# Patient Record
Sex: Female | Born: 2000 | Race: White | Hispanic: No | Marital: Single | Smoking: Never smoker
Health system: Southern US, Community
[De-identification: ages and names within clinical notes are randomized; demographics above are authoritative.]

---
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
## 2021-09-22 ENCOUNTER — Emergency Department (INDEPENDENT_AMBULATORY_CARE_PROVIDER_SITE_OTHER)
Admission: EM | Admit: 2021-09-22 | Discharge: 2021-09-22 | Disposition: A | Discharge status: Home / Self Care | Payer: BC Managed Care – PPO | Source: Home / Self Care

## 2021-09-22 DIAGNOSIS — R300 Dysuria: Secondary | ICD-10-CM | POA: Diagnosis not present

## 2021-09-22 DIAGNOSIS — R509 Fever, unspecified: Secondary | ICD-10-CM

## 2021-09-22 LAB — POCT URINALYSIS DIP (MANUAL ENTRY)
Bilirubin, UA: NEGATIVE
Blood, UA: NEGATIVE
Glucose, UA: NEGATIVE mg/dL
Ketones, POC UA: NEGATIVE mg/dL
Nitrite, UA: NEGATIVE
Protein Ur, POC: 100 mg/dL — AB
Spec Grav, UA: 1.015 (ref 1.010–1.025)
Urobilinogen, UA: 0.2 E.U./dL
pH, UA: 8.5 — AB (ref 5.0–8.0)

## 2021-09-22 MED ORDER — SULFAMETHOXAZOLE-TRIMETHOPRIM 800-160 MG PO TABS: 1.0000 | ORAL_TABLET | Freq: Two times a day (BID) | ORAL | 0 refills | Status: AC

## 2021-09-22 NOTE — ED Triage Notes (Signed)
Pt states that she has a fever,  back pain and urinary frequency. X2 days Pt states that she has a history of kidney infections.  Pt states that she does see a urologist but was feeling too bad to wait until appt.

## 2021-09-22 NOTE — Discharge Instructions (Addendum)
Advised patient to take medication as directed with food to completion.  Encourage patient increase daily water intake while taking this medication.  Advised patient we will follow-up with urine culture results, COVID-19 flu A/B results once received.

## 2021-09-22 NOTE — ED Provider Notes (Signed)
[facility]    CSN: [id number] Arrival date & time: 09/22/21  1747      History   Chief Complaint Chief Complaint  Patient presents with   Fever    Pt states that she has a fever, back pain, and urinary frequency. X2 days    HPI Victoria Solomon is a 20 y.o. female.   HPI 20 year old female presents with fever, back pain and urinary frequency x 2 days.  History reviewed. No pertinent past medical history.  There are no problems to display for this patient.   History reviewed. No pertinent surgical history.  OB History   No obstetric history on file.      Home Medications    Prior to Admission medications   Medication Sig Start Date End Date Taking? Authorizing Provider  sulfamethoxazole-trimethoprim (BACTRIM DS) 800-160 MG tablet Take 1 tablet by mouth 2 (two) times daily for 3 days. 09/22/21 09/25/21 Yes Trevor Iha, FNP    Family History History reviewed. No pertinent family history.  Social History Social History   Tobacco Use   Smoking status: Never   Smokeless tobacco: Never  Vaping Use   Vaping Use: Every day     Allergies   Patient has no known allergies.   Review of Systems Review of Systems  Constitutional:  Positive for fever.  Genitourinary:  Positive for frequency.  All other systems reviewed and are negative.   Physical Exam Triage Vital Signs ED Triage Vitals  Enc Vitals Group     BP 09/22/21 1821 104/70     Pulse Rate 09/22/21 1821 (!) 105     Resp --      Temp 09/22/21 1821 99.7 F (37.6 C)     Temp Source 09/22/21 1821 Oral     SpO2 09/22/21 1821 97 %     Weight 09/22/21 1818 185 lb (83.9 kg)     Height 09/22/21 1818 5\' 8"  (1.727 m)     Head Circumference --      Peak Flow --      Pain Score 09/22/21 1818 3     Pain Loc --      Pain Edu? --      Excl. in GC? --    No data found.  Updated Vital Signs BP 104/70 (BP Location: Right Arm)   Pulse (!) 105   Temp 99.7 F (37.6 C) (Oral)   Ht 5'  8" (1.727 m)   Wt 185 lb (83.9 kg)   LMP 09/08/2021   SpO2 97%   BMI 28.13 kg/m   Physical Exam Vitals and nursing note reviewed.  Constitutional:      General: She is not in acute distress.    Appearance: Normal appearance. She is obese. She is not ill-appearing.  HENT:     Head: Normocephalic and atraumatic.     Right Ear: Tympanic membrane, ear canal and external ear normal.     Left Ear: Tympanic membrane, ear canal and external ear normal.     Mouth/Throat:     Mouth: Mucous membranes are moist.     Pharynx: Oropharynx is clear.  Eyes:     Extraocular Movements: Extraocular movements intact.     Pupils: Pupils are equal, round, and reactive to light.  Cardiovascular:     Rate and Rhythm: Normal rate and regular rhythm.     Pulses: Normal pulses.     Heart sounds: Normal heart sounds.  Pulmonary:     Effort: Pulmonary effort is  normal.     Breath sounds: Normal breath sounds.  Musculoskeletal:        General: Normal range of motion.     Cervical back: Normal range of motion and neck supple.  Skin:    General: Skin is warm and dry.  Neurological:     General: No focal deficit present.     Mental Status: She is alert and oriented to person, place, and time.     UC Treatments / Results  Labs (all labs ordered are listed, but only abnormal results are displayed) Labs Reviewed  POCT URINALYSIS DIP (MANUAL ENTRY) - Abnormal; Notable for the following components:      Result Value   Clarity, UA cloudy (*)    pH, UA 8.5 (*)    Protein Ur, POC =100 (*)    Leukocytes, UA Trace (*)    All other components within normal limits  COVID-19, FLU A+B NAA  URINE CULTURE    EKG   Radiology No results found.  Procedures Procedures (including critical care time)  Medications Ordered in UC Medications - No data to display  Initial Impression / Assessment and Plan / UC Course  I have reviewed the triage vital signs and the nursing notes.  Pertinent labs & imaging  results that were available during my care of the patient were reviewed by me and considered in my medical decision making (see chart for details).     MDM: 1. Fever-COVID-19 flu A/B ordered; 2.  Urinary frequency-urine UA revealed above, urine culture ordered, Rx'd Bactrim. Advised patient to take medication as directed with food to completion.  Encourage patient increase daily water intake while taking this medication.  Advised patient we will follow-up with urine culture results, COVID-19 flu A/B results once received. Final Clinical Impressions(s) / UC Diagnoses   Final diagnoses:  Fever, unspecified  Dysuria     Discharge Instructions      Advised patient to take medication as directed with food to completion.  Encourage patient increase daily water intake while taking this medication.  Advised patient we will follow-up with urine culture results, COVID-19 flu A/B results once received.     ED Prescriptions     Medication Sig Dispense Auth. Provider   sulfamethoxazole-trimethoprim (BACTRIM DS) 800-160 MG tablet Take 1 tablet by mouth 2 (two) times daily for 3 days. 6 tablet Trevor Iha, FNP      PDMP not reviewed this encounter.   Trevor Iha, FNP 09/22/21 1907

## 2021-09-24 LAB — COVID-19, FLU A+B NAA
Influenza A, NAA: DETECTED — AB
Influenza B, NAA: NOT DETECTED
SARS-CoV-2, NAA: NOT DETECTED

## 2021-09-26 LAB — URINE CULTURE
MICRO NUMBER:: 12563152
SPECIMEN QUALITY:: ADEQUATE
